# Patient Record
Sex: Male | Born: 1985
Health system: Southern US, Community
[De-identification: ages and names within clinical notes are randomized; demographics above are authoritative.]

---

## 2015-09-20 ENCOUNTER — Encounter: Payer: Self-pay | Admitting: Osteopathic Medicine

## 2015-09-20 ENCOUNTER — Ambulatory Visit (INDEPENDENT_AMBULATORY_CARE_PROVIDER_SITE_OTHER): Payer: Commercial Managed Care - PPO | Admitting: Osteopathic Medicine

## 2015-09-20 VITALS — BP 139/83 | HR 89 | Ht 73.0 in | Wt 212.0 lb

## 2015-09-20 DIAGNOSIS — Z0000 Encounter for general adult medical examination without abnormal findings: Secondary | ICD-10-CM | POA: Diagnosis not present

## 2015-09-20 DIAGNOSIS — Z113 Encounter for screening for infections with a predominantly sexual mode of transmission: Secondary | ICD-10-CM | POA: Diagnosis not present

## 2015-09-20 DIAGNOSIS — Z008 Encounter for other general examination: Secondary | ICD-10-CM

## 2015-09-20 DIAGNOSIS — Z0189 Encounter for other specified special examinations: Secondary | ICD-10-CM | POA: Diagnosis not present

## 2015-09-20 NOTE — Progress Notes (Signed)
HPI: Flonnie HailstoneJamal Russo is a 30 y.o. male  who [facility] to [facility] today, 09/20/15,  for chief complaint of:  Chief Complaint  Patient presents with  . Establish Care    Biometric Screening and completion of form for work/insurance. No complaints or additional concerns today. See below for review preventive care.   Past medical, surgical, social and family history reviewed: No past medical history on file. No past surgical history on file. Social History  Substance Use Topics  . Smoking status: Not on file  . Smokeless tobacco: Not on file  . Alcohol use Not on file   No family history on file.   Current medication list and allergy/intolerance information reviewed:   No current outpatient prescriptions on file.   No current facility-administered medications for this visit.    Allergies not on file    Review of Systems:  Constitutional:  No  fever, no chills, No recent illness, No unintentional weight changes. No significant fatigue.   HEENT: No  headache, no vision change, no hearing change, No sore throat, No  sinus pressure  Cardiac: No  chest pain, No  pressure, No palpitations, No  Orthopnea  Respiratory:  No  shortness of breath. No  Cough  Gastrointestinal: No  abdominal pain, No  nausea, No  vomiting,  No  blood in stool, No  diarrhea, No  constipation   Musculoskeletal: No new myalgia/arthralgia  Genitourinary: No  incontinence, No  abnormal genital bleeding, No abnormal genital discharge  Skin: No  Rash, No other wounds/concerning lesions  Hem/Onc: No  easy bruising/bleeding, No  abnormal lymph node  Endocrine: No cold intolerance,  No heat intolerance. No polyuria/polydipsia/polyphagia   Neurologic: No  weakness, No  dizziness, No  slurred speech/focal weakness/facial droop  Psychiatric: No  concerns with depression, No  concerns with anxiety, No sleep problems, No mood problems, PHQ2 neg   Exam:  BP 139/83   Pulse  89   Ht 6\' 1"  (1.854 m)   Wt 212 lb (96.2 kg)   BMI 27.97 kg/m   Constitutional: VS see above. General Appearance: alert, well-developed, well-nourished, NAD  Eyes: Normal lids and conjunctive, non-icteric sclera  Ears, Nose, Mouth, Throat: MMM, Normal external inspection ears/nares/mouth/lips/gums. TM normal bilaterally. Pharynx/tonsils no erythema, no exudate. Nasal mucosa normal.   Neck: No masses, trachea midline. No thyroid enlargement. No tenderness/mass appreciated. No lymphadenopathy  Respiratory: Normal respiratory effort. no wheeze, no rhonchi, no rales  Cardiovascular: S1/S2 normal, no murmur, no rub/gallop auscultated. RRR. No lower extremity edema. Pedal pulse II/IV bilaterally DP and PT. No carotid bruit or JVD. No abdominal aortic bruit.  Gastrointestinal: Nontender, no masses. No hepatomegaly, no splenomegaly. No hernia appreciated. Bowel sounds normal. Rectal exam deferred.   Musculoskeletal: Gait normal. No clubbing/cyanosis of digits.   Neurological: No cranial nerve deficit on limited exam. Motor and sensation intact and symmetric. Cerebellar reflexes intact. Normal balance/coordination. No tremor.   Skin: warm, dry, intact. No rash/ulcer. No concerning nevi or subq nodules on limited exam.    Psychiatric: Normal judgment/insight. Normal mood and affect. Oriented x3.     MALE PREVENTIVE CARE updated 09/20/15  ANNUAL SCREENING/COUNSELING  Any changes to health in the past year? no  Tobacco - 1 ppd x 5 years, (+) interest in quitting   Alcohol - social drinker  Diet/Exercise - Healthy habits discussed to decrease CV risk and promote overall health. Patient does not have dietary restrictions.   Depression - PQH2 Negative  Feel  safe at home? - yes  HTN SCREENING - SEE VITALS  SEXUAL/REPRODUCTIVE HEALTH  Sexually active? - Yes with male.  STI testing needed/desired today? - yes  Any concerns with testosterone/libido? - no  INFECTIOUS DISEASE  SCREENING  HIV - needs  GC/CT - needs  HepC - does not need  TB - does not need  CANCER SCREENING  Lung - does not need  Colon - does not need  Prostate - does not need  OTHER DISEASE SCREENING  Lipid - needs  DM2 - needs  Osteoporosis - does not need  ADULT VACCINATION  Influenza - needs today, annual vaccine recommended but declined  Td - was not indicated  Zoster - was not indicated  Pneumonia - was not indicated     ASSESSMENT/PLAN:   Annual physical exam - Plan: Lipid panel, CMP and Liver, CBC  Encounter for biometric screening  Routine screening for STI (sexually transmitted infection) - Plan: HIV antibody, RPR, Hepatitis B surface antigen, Hepatitis B core antibody, total, Chlamydia/Gonococcus/Trichomonas, NAA   Plan:  1. You were given orders for labs/blood draw - please have this done at [facility] and results will be sent to [place].  2. If any concerning results, we may ask you to come in for a visit to discuss these results. We will call you - please let us know if you don't hear back about your results.  3. As long as you're doing well, come back in one year for routine physical. 4. Please confirm your last Tetanus booster - this should be given every 10 years.     Visit summary with medication list and pertinent instructions was printed for patient to review. All questions at time of visit were answered - patient instructed to contact office with any additional concerns. ER/RTC precautions were reviewed with the patient. Follow-up plan: Return in about 1 year (around 09/19/2016), or sooner i fneeded, for ANNUAL PHYSICAL.

## 2015-09-20 NOTE — Patient Instructions (Addendum)
Plan:  1. You were given orders for labs/blood draw - please have this done at [facility] and results will be sent to [place].  2. If any concerning results, we may ask you to come in for a visit to discuss these results. We will call you - please let us know if you don't hear back about your results.  3. As long as you're doing well, come back in one year for routine physical. 4. Please confirm your last Tetanus booster - this should be given every 10 years.        Smoking Cessation, Tips for Success If you are ready to quit smoking, congratulations! You have chosen to help yourself be healthier. Cigarettes bring nicotine, tar, carbon monoxide, and other irritants into your body. Your lungs, heart, and blood vessels will be able to work better without these poisons. There are many different ways to quit smoking. Nicotine gum, nicotine patches, a nicotine inhaler, or nicotine nasal spray can help with physical craving. Hypnosis, support groups, and medicines help break the habit of smoking. WHAT THINGS CAN I DO TO MAKE QUITTING EASIER?  Here are some tips to help you quit for good:  Pick a date when you will quit smoking completely. Tell all of your friends and family about your plan to quit on that date.  Do not try to slowly cut down on the number of cigarettes you are smoking. Pick a quit date and quit smoking completely starting on that day.  Throw away all cigarettes.   Clean and remove all ashtrays from your home, work, and car.  On a card, write down your reasons for quitting. Carry the card with you and read it when you get the urge to smoke.  Cleanse your body of nicotine. Drink enough water and fluids to keep your urine clear or pale yellow. Do this after quitting to flush the nicotine from your body.  Learn to predict your moods. Do not let a bad situation be your excuse to have a cigarette. Some situations in your life might tempt you into wanting a cigarette.  Never have "just  one" cigarette. It leads to wanting another and another. Remind yourself of your decision to quit.  Change habits associated with smoking. If you smoked while driving or when feeling stressed, try other activities to replace smoking. Stand up when drinking your coffee. Brush your teeth after eating. Sit in a different chair when you read the paper. Avoid alcohol while trying to quit, and try to drink fewer caffeinated beverages. Alcohol and caffeine may urge you to smoke.  Avoid foods and drinks that can trigger a desire to smoke, such as sugary or spicy foods and alcohol.  Ask people who smoke not to smoke around you.  Have something planned to do right after eating or having a cup of coffee. For example, plan to take a walk or exercise.  Try a relaxation exercise to calm you down and decrease your stress. Remember, you may be tense and nervous for the first 2 weeks after you quit, but this will pass.  Find new activities to keep your hands busy. Play with a pen, coin, or rubber band. Doodle or draw things on paper.  Brush your teeth right after eating. This will help cut down on the craving for the taste of tobacco after meals. You can also try mouthwash.   Use oral substitutes in place of cigarettes. Try using lemon drops, carrots, cinnamon sticks, or chewing gum. Keep them  handy so they are available when you have the urge to smoke.  When you have the urge to smoke, try deep breathing.  Designate your home as a nonsmoking area.  If you are a heavy smoker, ask your health care provider about a prescription for nicotine chewing gum. It can ease your withdrawal from nicotine.  Reward yourself. Set aside the cigarette money you save and buy yourself something nice.  Look for support from others. Join a support group or smoking cessation program. Ask someone at home or at work to help you with your plan to quit smoking.  Always ask yourself, "Do I need this cigarette or is this just a  reflex?" Tell yourself, "Today, I choose not to smoke," or "I do not want to smoke." You are reminding yourself of your decision to quit.  Do not replace cigarette smoking with electronic cigarettes (commonly called e-cigarettes). The safety of e-cigarettes is unknown, and some may contain harmful chemicals.  If you relapse, do not give up! Plan ahead and think about what you will do the next time you get the urge to smoke. HOW WILL I FEEL WHEN I QUIT SMOKING? You may have symptoms of withdrawal because your body is used to nicotine (the addictive substance in cigarettes). You may crave cigarettes, be irritable, feel very hungry, cough often, get headaches, or have difficulty concentrating. The withdrawal symptoms are only temporary. They are strongest when you first quit but will go away within 10-14 days. When withdrawal symptoms occur, stay in control. Think about your reasons for quitting. Remind yourself that these are signs that your body is healing and getting used to being without cigarettes. Remember that withdrawal symptoms are easier to treat than the major diseases that smoking can cause.  Even after the withdrawal is over, expect periodic urges to smoke. However, these cravings are generally short lived and will go away whether you smoke or not. Do not smoke! WHAT RESOURCES ARE AVAILABLE TO HELP ME QUIT SMOKING? Your health care provider can direct you to community resources or hospitals for support, which may include:  Group support.  Education.  Hypnosis.  Therapy.   This information is not intended to replace advice given to you by your health care provider. Make sure you discuss any questions you have with your health care provider.   Document Released: 10/06/2003 Document Revised: 01/28/2014 Document Reviewed: 06/25/2012 Elsevier Interactive Patient Education [employer]2016 [employer]vier Inc.

## 2015-09-21 LAB — CHLAMYDIA/GC NAA, CONFIRMATION
Chlamydia by NAA: NEGATIVE
Gonococcus by NAA: NEGATIVE
Trich vag by NAA: NEGATIVE

## 2015-09-21 LAB — BASIC METABOLIC PANEL
BUN: 9 mg/dL (ref 4–21)
BUN: 9 mg/dL (ref 4–21)
CREATININE: 1.2 mg/dL (ref 0.6–1.3)
Creatinine: 1.2 mg/dL (ref 0.6–1.3)
Glucose: 104 mg/dL
Glucose: 104 mg/dL
Potassium: 4.9 mmol/L (ref 3.4–5.3)
SODIUM: 143 mmol/L (ref 137–147)

## 2015-09-21 LAB — LIPID PANEL
CHOLESTEROL: 183 mg/dL (ref 0–200)
HDL: 53 mg/dL (ref 35–70)
LDL Cholesterol: 102 mg/dL
Triglycerides: 138 mg/dL (ref 40–160)

## 2015-09-21 LAB — CBC AND DIFFERENTIAL
HEMATOCRIT: 48 % (ref 41–53)
HEMOGLOBIN: 16.6 g/dL (ref 13.5–17.5)
Hemoglobin: 16.6 g/dL (ref 13.5–17.5)
WBC: 5.6 10^3/mL
WBC: 5.9 10*3/mL

## 2015-09-21 LAB — HEPATIC FUNCTION PANEL
ALT: 93 U/L — AB (ref 10–40)
ALT: 93 U/L — AB (ref 10–40)
AST: 65 U/L — AB (ref 14–40)
AST: 65 U/L — AB (ref 14–40)
Alkaline Phosphatase: 106 U/L (ref 25–125)
BILIRUBIN DIRECT: 0.17 mg/dL (ref 0.01–0.4)
BILIRUBIN, TOTAL: 0.4 mg/dL
Bilirubin, Total: 0.4 mg/dL

## 2015-09-21 LAB — RPR, QUANT. (REFLEX): RPR: NEGATIVE

## 2015-09-21 LAB — HIV ANTIBODY (ROUTINE TESTING W REFLEX): HIV SCREEN 4TH GENERATION: NEGATIVE

## 2015-09-21 LAB — HEPATITIS B SURFACE ANTIGEN
HEP B S AG: NEGATIVE
Hep B Core Ab, Tot: NEGATIVE
Hep C Virus Ab: <0.1

## 2015-09-22 ENCOUNTER — Telehealth: Payer: Self-pay | Admitting: Osteopathic Medicine

## 2015-09-22 NOTE — Telephone Encounter (Signed)
Please call patient: I have reviewed his lab results. Need to confirm whether or not he was fasting for these labs, sugar was very mildly elevated at 104, anything over 100 fasting is considered abnormal. More concerning though is to have his liver enzymes were significantly elevated. Not to a dangerous level but this is definitely something that needs to be followed up. Would recommend he schedule an office visit to talk more about this and discuss what needs to be done as far as further testing.  I placed labs ([facility]) in your basket to abstract/scan  Also, can we call [facility] and see if we can add on hepatitis C screening, I thought it ordered this but it looks like I didn't, my apologies. If patient asks, hepatitis B was negative, hepatitis C testing is pending, other labs were normal including negative HIV and syphilis, cholesterol was okay, all other labs were fine

## 2015-09-26 NOTE — Telephone Encounter (Signed)
Called [facility], added Hep C antibody. They will fax results.

## 2015-09-26 NOTE — Telephone Encounter (Signed)
Left VM for Pt to return clinic call regarding results. Callback information provided.

## 2015-09-27 NOTE — Telephone Encounter (Signed)
Thanks. If we don't hear from him, may need to send letter. I've set reminder to follow up on this unless we hear sooner.

## 2015-10-06 ENCOUNTER — Encounter: Payer: Self-pay | Admitting: *Deleted

## 2015-10-06 LAB — RPR, QUANT. (REFLEX)

## 2015-10-17 ENCOUNTER — Telehealth: Payer: Self-pay | Admitting: Osteopathic Medicine

## 2015-10-17 NOTE — Telephone Encounter (Signed)
Letter sent regarding lab results.

## 2015-10-20 ENCOUNTER — Encounter: Payer: Self-pay | Admitting: Osteopathic Medicine

## 2015-10-31 ENCOUNTER — Ambulatory Visit (INDEPENDENT_AMBULATORY_CARE_PROVIDER_SITE_OTHER): Payer: Commercial Managed Care - PPO | Admitting: Osteopathic Medicine

## 2015-10-31 ENCOUNTER — Encounter: Payer: Self-pay | Admitting: Osteopathic Medicine

## 2015-10-31 VITALS — BP 135/85 | HR 85 | Ht 73.0 in | Wt 221.0 lb

## 2015-10-31 DIAGNOSIS — R748 Abnormal levels of other serum enzymes: Secondary | ICD-10-CM

## 2015-10-31 DIAGNOSIS — L738 Other specified follicular disorders: Secondary | ICD-10-CM | POA: Diagnosis not present

## 2015-10-31 NOTE — Patient Instructions (Signed)
Benzoyl Peroxide wash for skin. If no better with this plus exfoliating, call and let me know and we can try a medication.  Will repeat labs and see if liver tests are still high, will need to do further workup.

## 2015-10-31 NOTE — Progress Notes (Signed)
HPI: James Russo is a 30 y.o. male  who presents [facility] [place] today, 10/31/15,  for chief complaint of:  Chief Complaint  Patient presents with  . Follow-up    LABS    Abn labs . Context: elevated liver enzymes, new problem . Severity: >2x ULN AST . Modifying factors: pt reports heavy alcohol use weekend prior to blood draw, no known FH liver problems except fatty liver in father who is a diabetic . Assoc signs/symptoms: no abdominal pain or jaundice  Skin concern: . Location: upper back/shoulders . Quality: bumps, ?acne, not itching . Modifying factors: regular soap, exfoliation tried   Past medical, surgical, social and family history reviewed: No past medical history on file. No past surgical history on file. Social History  Substance Use Topics  . Smoking status: Current Every Day Smoker  . Smokeless tobacco: Never Used  . Alcohol use Not on file   Family History  Problem Relation Age of Onset  . Hypertension Mother   . Hypertension Father   . Alcohol abuse Maternal Uncle      Current medication list and allergy/intolerance information reviewed:   No current outpatient prescriptions on file.   No current facility-administered medications for this visit.    No Known Allergies    Review of Systems:  Constitutional:  No  fever, no chills, No recent illness  Cardiac: No  chest pain  Respiratory:  No  shortness of breath.   Gastrointestinal: No  abdominal pain, No  nausea, No  vomiting,  No  blood in stool,   Skin: + acne vs Rash, No other wounds/concerning lesions  Neurologic: No  weakness, No  dizziness  Exam:  BP 135/85   Pulse 85   Ht 6\' 1"  (1.854 m)   Wt 221 lb (100.2 kg)   BMI 29.16 kg/m   Constitutional: VS see above. General Appearance: alert, well-developed, well-nourished, NAD  Eyes: Normal lids and conjunctive, non-icteric sclera  Ears, Nose, Mouth, Throat: MMM  Neck: No masses, trachea midline.    Respiratory: Normal respiratory effort.   Skin: warm, dry, intact. No rash/ulcer. No concerning nevi or subq nodules on limited exam. +mild acne vs pseudofolliculitis  Psychiatric: Normal judgment/insight. Normal mood and affect. Oriented x3.    ASSESSMENT/PLAN:   Labs reviewed in detail with the patient. Explained process of repeating labs to confirm, additional workup based on whether enzymes just all elevated. Advised patient weight several days after he has had any alcohol. Advised on safe alcohol consumption practices.   Can consider retinoids or topical therapy for pseudofolliculitis versus acne  Elevated liver enzymes - Plan: CMP and Liver, TSH, Iron and TIBC  Pseudofolliculitis   Patient Instructions  Benzoyl Peroxide wash for skin. If no better with this plus exfoliating, call and let me know and we can try a medication.  Will repeat labs and see if liver tests are still high, will need to do further workup.     Visit summary with medication list and pertinent instructions was printed for patient to review. All questions at time of visit were answered - patient instructed to contact office with any additional concerns. ER/RTC precautions were reviewed with the patient. Follow-up plan: No Follow-up on file.

## 2016-09-27 ENCOUNTER — Encounter: Payer: Self-pay | Admitting: Osteopathic Medicine

## 2016-09-27 ENCOUNTER — Ambulatory Visit (INDEPENDENT_AMBULATORY_CARE_PROVIDER_SITE_OTHER): Payer: 59 | Admitting: Osteopathic Medicine

## 2016-09-27 VITALS — BP 143/89 | HR 109 | Temp 98.8°F | Ht 73.0 in | Wt 228.0 lb

## 2016-09-27 DIAGNOSIS — B9789 Other viral agents as the cause of diseases classified elsewhere: Secondary | ICD-10-CM | POA: Diagnosis not present

## 2016-09-27 DIAGNOSIS — F10982 Alcohol use, unspecified with alcohol-induced sleep disorder: Secondary | ICD-10-CM | POA: Diagnosis not present

## 2016-09-27 DIAGNOSIS — G4709 Other insomnia: Secondary | ICD-10-CM | POA: Diagnosis not present

## 2016-09-27 DIAGNOSIS — J069 Acute upper respiratory infection, unspecified: Secondary | ICD-10-CM | POA: Diagnosis not present

## 2016-09-27 DIAGNOSIS — H578 Other specified disorders of eye and adnexa: Secondary | ICD-10-CM

## 2016-09-27 DIAGNOSIS — F1010 Alcohol abuse, uncomplicated: Secondary | ICD-10-CM

## 2016-09-27 DIAGNOSIS — R748 Abnormal levels of other serum enzymes: Secondary | ICD-10-CM

## 2016-09-27 DIAGNOSIS — G4700 Insomnia, unspecified: Secondary | ICD-10-CM | POA: Insufficient documentation

## 2016-09-27 DIAGNOSIS — H5789 Other specified disorders of eye and adnexa: Secondary | ICD-10-CM

## 2016-09-27 MED ORDER — IPRATROPIUM BROMIDE 0.06 % NA SOLN: 2.0000 | Freq: Four times a day (QID) | NASAL | 12 refills | Status: DC

## 2016-09-27 MED ORDER — BENZONATATE 200 MG PO CAPS: 200.0000 mg | ORAL_CAPSULE | Freq: Three times a day (TID) | ORAL | 0 refills | Status: DC | PRN

## 2016-09-27 MED ORDER — POLYMYXIN B-TRIMETHOPRIM 10000-0.1 UNIT/ML-% OP SOLN: 1.0000 [drp] | OPHTHALMIC | 0 refills | Status: DC

## 2016-09-27 NOTE — Patient Instructions (Signed)
Plan:  Treat eye and symptoms of upper respiratory infection  Labs today to recheck liver  Reduce/stop alcohol use! Seek medical care if stopping causes: panic/anxiety, sweats, shakes/jitters, heart flutters, headache, nausea/vomiting   Depending on labs, may need further workup  We may be able to go ahead and start an insomnia medication depending on lab results - will call you

## 2016-09-27 NOTE — Progress Notes (Signed)
HPI: James Russo is a 31 y.o. male  who [facility] [place] today, 09/27/16,  for chief complaint of:  Chief Complaint  Patient presents with  . Eye Problem    Left eye red    Left eye red and irritated for about a day. Associated with subjective fever, sinus pressure and drainage, sore throat, slight dry cough. Ongoing for about a day.   Insomnia: Patient has been self-medicating with alcohol, he states "I drink quite a good bit," reports at least 5-8 12 ounce beers per evening. He quit for about a month in the past but noticed that insomnia was still an issue for him.  Elevated liver enzymes: Patient did not get blood work repeated after last visit.   Past medical history, surgical history, social history and family history reviewed.  Patient Active Problem List   Diagnosis Date Noted  . 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 Elevated liver enzymes 10/31/2015    Current medication list and allergy/intolerance information reviewed.   No current outpatient prescriptions on file prior to visit.   No current facility-administered medications on file prior to visit.    No Known Allergies    Review of Systems:  Constitutional: +recent illness  HEENT: +sinus headache, no vision change, +sore throat, +eye red as per HPI  Cardiac: No  chest pain, No  pressure, No palpitations  Respiratory:  No  shortness of breath. +dry Cough  Gastrointestinal: No  abdominal pain, no change on bowel habits, no N/V  Musculoskeletal: No new myalgia/arthralgia  Skin: No  Rash  Neurologic: No  weakness, No  Dizziness  Psychiatric: No  concerns with depression, No  concerns with anxiety  Exam:  BP (!) 143/89   Pulse (!) 109   Temp 98.8 F (37.1 C) (Oral)   Ht 6\' 1"  (1.854 m)   Wt 228 lb (103.4 kg)   BMI 30.08 kg/m   Constitutional: VS see above. General Appearance: alert, well-developed, well-nourished, NAD  Eyes: Normal lids, non-icteric sclera.  Left eye demonstrates conjunctival injection, no purulent drainage  Ears, Nose, Mouth, Throat: MMM, Normal external inspection ears/nares/mouth/lips/gums. Pharyngeal erythema but no exudate.  Neck: No masses, trachea midline. Reports slight tenderness to submandibular lymph nodes but no enlargement, skin normal  Respiratory: Normal respiratory effort. no wheeze, no rhonchi, no rales  Cardiovascular: S1/S2 normal, no murmur, no rub/gallop auscultated. RRR.   Musculoskeletal: Gait normal. Symmetric and independent movement of all extremities  Neurological: Normal balance/coordination. No tremor.  Skin: warm, dry, intact.   Psychiatric: Normal judgment/insight. Normal mood and affect. Oriented x3.      ASSESSMENT/PLAN:   Red eye - Plan: trimethoprim-polymyxin b (POLYTRIM) ophthalmic solution  Elevated liver enzymes - Plan: COMPLETE METABOLIC PANEL WITH GFR, TSH, Fe+TIBC+Fer  Other insomnia - Plan: COMPLETE METABOLIC PANEL WITH GFR, TSH  Viral URI with cough - Plan: benzonatate (TESSALON) 200 MG capsule, ipratropium (ATROVENT) 0.06 % nasal spray    Patient Instructions  Plan:  Treat eye and symptoms of upper respiratory infection  Labs today to recheck liver  Reduce/stop alcohol use! Seek medical care if stopping causes: panic/anxiety, sweats, shakes/jitters, heart flutters, headache, nausea/vomiting   Depending on labs, may need further workup  We may be able to go ahead and start an insomnia medication depending on lab results - will call you     Follow-up plan: Return for recheck insomnia and review labs 1-2 weeks .  Visit summary with medication list and pertinent instructions was printed for patient  to review, alert [place] if any changes needed. All questions at time of visit were answered - patient instructed to contact office with any additional concerns. ER/RTC precautions were reviewed with the patient and understanding verbalized.   Note: Total time spent 25  minutes, greater than 50% of the visit was spent face-to-face counseling and coordinating care for the following: The primary encounter diagnosis was Red eye. Diagnoses of Elevated liver enzymes, Other insomnia, and Viral URI with cough were also pertinent to this visit.Marland Kitchen

## 2016-09-28 LAB — IRON,TIBC AND FERRITIN PANEL
%SAT: 4 % — AB (ref 15–60)
Ferritin: 257 ng/mL (ref 20–345)
Iron: 18 ug/dL — ABNORMAL LOW (ref 50–195)
TIBC: 420 ug/dL (ref 250–425)

## 2016-09-28 LAB — COMPLETE METABOLIC PANEL WITH GFR
AG Ratio: 1.5 (calc) (ref 1.0–2.5)
ALT: 69 U/L — ABNORMAL HIGH (ref 9–46)
AST: 43 U/L — ABNORMAL HIGH (ref 10–40)
Albumin: 4.8 g/dL (ref 3.6–5.1)
Alkaline phosphatase (APISO): 95 U/L (ref 40–115)
BUN: 12 mg/dL (ref 7–25)
CO2: 31 mmol/L (ref 20–32)
Calcium: 9.7 mg/dL (ref 8.6–10.3)
Chloride: 100 mmol/L (ref 98–110)
Creat: 1.29 mg/dL (ref 0.60–1.35)
GFR, Est African American: 86 mL/min/{1.73_m2} (ref >=60)
GFR, Est Non African American: 74 mL/min/{1.73_m2} (ref >=60)
Globulin: 3.2 g/dL (calc) (ref 1.9–3.7)
Glucose, Bld: 97 mg/dL (ref 65–99)
Potassium: 3.9 mmol/L (ref 3.5–5.3)
Sodium: 137 mmol/L (ref 135–146)
Total Bilirubin: 1.1 mg/dL (ref 0.2–1.2)
Total Protein: 8 g/dL (ref 6.1–8.1)

## 2016-09-28 LAB — TSH: TSH: 0.7 m[IU]/L (ref 0.40–4.50)

## 2016-09-30 MED ORDER — TRAZODONE HCL 100 MG PO TABS
ORAL_TABLET | ORAL | 1 refills | Status: DC
Start: 2016-09-30 — End: 2017-02-22

## 2016-09-30 NOTE — Addendum Note (Signed)
Addended by: Deirdre PippinsALEXANDER, Eriko Economos M on: 09/30/2016 04:31 PM   Modules accepted: Orders

## 2016-10-10 ENCOUNTER — Ambulatory Visit: Payer: 59 | Admitting: Osteopathic Medicine

## 2016-10-10 DIAGNOSIS — Z0189 Encounter for other specified special examinations: Secondary | ICD-10-CM

## 2017-02-22 ENCOUNTER — Other Ambulatory Visit: Payer: Self-pay | Admitting: Osteopathic Medicine

## 2017-12-09 DIAGNOSIS — Z20818 Contact with and (suspected) exposure to other bacterial communicable diseases: Secondary | ICD-10-CM | POA: Diagnosis not present

## 2017-12-09 DIAGNOSIS — J029 Acute pharyngitis, unspecified: Secondary | ICD-10-CM | POA: Diagnosis not present

## 2018-04-27 ENCOUNTER — Other Ambulatory Visit: Payer: Self-pay

## 2018-04-27 ENCOUNTER — Encounter: Payer: Self-pay | Admitting: Osteopathic Medicine

## 2018-04-27 ENCOUNTER — Ambulatory Visit: Payer: BLUE CROSS/BLUE SHIELD | Admitting: Osteopathic Medicine

## 2018-04-27 VITALS — BP 133/87 | HR 96 | Temp 98.7°F | Wt 243.8 lb

## 2018-04-27 DIAGNOSIS — L0291 Cutaneous abscess, unspecified: Secondary | ICD-10-CM

## 2018-04-27 NOTE — Progress Notes (Signed)
HPI: James Russo is a 33 y.o. male who  has no past medical history on file.  he presents to [facility] today, 04/27/18,  for chief complaint of:  Skin problem: boil on face   . Location: R face lateral to eye . Quality/severity: was larger and more painful but now smaller and sore without significant tenderness.  . Duration: few weeks total  . Modifying factors: running warm water over it in the shower twice a day helped  . Assoc signs/symptoms: no fever/ no eye problems      At today's visit 04/27/18 ... PMH, PSH, FH reviewed and updated as needed.  Current medication list and allergy/intolerance hx reviewed and updated as needed. (See remainder of HPI, ROS, Phys Exam below)   No results found.  No results found for this or any previous visit (from the past 72 hour(s)).        ASSESSMENT/PLAN: The encounter diagnosis was Abscess. healing well, indurated, no erythema, minimal tenderness, pt reports improvement. I think we can be more diligent about warm compresses and see if this resolves on its own over the next few weeks, I&D doesn't seem warranted at this time.       Follow-up plan: Return if symptoms worsen or fail to improve.                                                 ################################################# ################################################# ################################################# #################################################    No outpatient medications have been marked as taking for the 04/27/18 encounter (Office Visit) with Sunnie Nielsen, DO.    No Known Allergies     Review of Systems:  Constitutional: No recent illness  HEENT: No  headache, no vision change  Skin: No  Rash, +bump as per HPI  Neurologic: No  weakness, No  Dizziness   Exam:  BP 133/87 (BP Location: Left Arm, Patient Position: Sitting, Cuff Size:  Large)   Pulse 96   Temp 98.7 F (37.1 C) (Oral)   Wt 243 lb 12.8 oz (110.6 kg)   BMI 32.17 kg/m   Constitutional: VS see above. General Appearance: alert, well-developed, well-nourished, NAD  Eyes: Normal lids and conjunctive, non-icteric sclera  Ears, Nose, Mouth, Throat: MMM, Normal external inspection ears/nares/mouth/lips/gums.  Neck: No masses, trachea midline.   Respiratory: Normal respiratory effort.  Musculoskeletal: Gait normal. Symmetric and independent movement of all extremities  Neurological: Normal balance/coordination. No tremor.  Skin: warm, dry, intact. Small relatively mobile subq mass about pea-sized, lateral to R eye, no erythema, no fluctuance  Psychiatric: Normal judgment/insight. Normal mood and affect. Oriented x3.       Visit summary with medication list and pertinent instructions was printed for patient to review, patient was advised to alert [place] if any updates are needed. All questions at time of visit were answered - patient instructed to contact office with any additional concerns. ER/RTC precautions were reviewed with the patient and understanding verbalized.     Please note: voice recognition software was used to produce this document, and typos may escape review. Please contact Dr. Lyn Hollingshead for any needed clarifications.    Follow up plan: Return if symptoms worsen or fail to improve.

## 2018-07-07 DIAGNOSIS — K529 Noninfective gastroenteritis and colitis, unspecified: Secondary | ICD-10-CM | POA: Diagnosis not present

## 2018-07-14 ENCOUNTER — Ambulatory Visit (INDEPENDENT_AMBULATORY_CARE_PROVIDER_SITE_OTHER): Payer: BC Managed Care – PPO | Admitting: Osteopathic Medicine

## 2018-07-14 ENCOUNTER — Other Ambulatory Visit: Payer: Self-pay

## 2018-07-14 ENCOUNTER — Encounter: Payer: Self-pay | Admitting: Osteopathic Medicine

## 2018-07-14 VITALS — BP 145/88 | HR 96 | Temp 98.8°F | Wt 242.6 lb

## 2018-07-14 DIAGNOSIS — L723 Sebaceous cyst: Secondary | ICD-10-CM

## 2018-07-14 DIAGNOSIS — S80211A Abrasion, right knee, initial encounter: Secondary | ICD-10-CM | POA: Diagnosis not present

## 2018-07-14 DIAGNOSIS — R748 Abnormal levels of other serum enzymes: Secondary | ICD-10-CM

## 2018-07-14 DIAGNOSIS — H1131 Conjunctival hemorrhage, right eye: Secondary | ICD-10-CM

## 2018-07-14 DIAGNOSIS — Z0000 Encounter for general adult medical examination without abnormal findings: Secondary | ICD-10-CM
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
## 2018-07-22 DIAGNOSIS — Z0000 Encounter for general adult medical examination without abnormal findings: Secondary | ICD-10-CM | POA: Diagnosis not present

## 2018-07-23 LAB — CBC
HCT: 47.1 % (ref 38.5–50.0)
Hemoglobin: 16.2 g/dL (ref 13.2–17.1)
MCH: 29.2 pg (ref 27.0–33.0)
MCHC: 34.4 g/dL (ref 32.0–36.0)
MCV: 85 fL (ref 80.0–100.0)
MPV: 11.4 fL (ref 7.5–12.5)
Platelets: 215 10*3/uL (ref 140–400)
RBC: 5.54 10*6/uL (ref 4.20–5.80)
RDW: 14.3 % (ref 11.0–15.0)
WBC: 9.3 10*3/uL (ref 3.8–10.8)

## 2018-07-23 LAB — COMPLETE METABOLIC PANEL WITH GFR
AG Ratio: 1.8 (calc) (ref 1.0–2.5)
ALT: 94 U/L — ABNORMAL HIGH (ref 9–46)
AST: 57 U/L — ABNORMAL HIGH (ref 10–40)
Albumin: 5.1 g/dL (ref 3.6–5.1)
Alkaline phosphatase (APISO): 85 U/L (ref 36–130)
BUN: 10 mg/dL (ref 7–25)
CO2: 25 mmol/L (ref 20–32)
Calcium: 9.9 mg/dL (ref 8.6–10.3)
Chloride: 104 mmol/L (ref 98–110)
Creat: 1.24 mg/dL (ref 0.60–1.35)
GFR, Est African American: 90 mL/min/{1.73_m2} (ref >=60)
GFR, Est Non African American: 78 mL/min/{1.73_m2} (ref >=60)
Globulin: 2.8 g/dL (calc) (ref 1.9–3.7)
Glucose, Bld: 88 mg/dL (ref 65–99)
Potassium: 4.1 mmol/L (ref 3.5–5.3)
Sodium: 139 mmol/L (ref 135–146)
Total Bilirubin: 0.9 mg/dL (ref 0.2–1.2)
Total Protein: 7.9 g/dL (ref 6.1–8.1)

## 2018-07-23 LAB — LIPID PANEL
Cholesterol: 195 mg/dL (ref <200)
HDL: 56 mg/dL (ref >=40)
LDL Cholesterol (Calc): 120 mg/dL (calc) — ABNORMAL HIGH
Non-HDL Cholesterol (Calc): 139 mg/dL (calc) — ABNORMAL HIGH (ref <130)
Total CHOL/HDL Ratio: 3.5 (calc) (ref <5.0)
Triglycerides: 91 mg/dL (ref <150)

## 2018-07-23 NOTE — Addendum Note (Signed)
Addended by: Maryla Morrow on: 07/23/2018 07:23 AM   Modules accepted: Orders

## 2018-07-29 ENCOUNTER — Other Ambulatory Visit: Payer: Self-pay

## 2018-07-29 ENCOUNTER — Ambulatory Visit (INDEPENDENT_AMBULATORY_CARE_PROVIDER_SITE_OTHER): Payer: BC Managed Care – PPO

## 2018-07-29 DIAGNOSIS — R748 Abnormal levels of other serum enzymes: Secondary | ICD-10-CM | POA: Diagnosis not present

## 2018-07-29 DIAGNOSIS — R945 Abnormal results of liver function studies: Secondary | ICD-10-CM | POA: Diagnosis not present

## 2018-07-29 DIAGNOSIS — K760 Fatty (change of) liver, not elsewhere classified: Secondary | ICD-10-CM

## 2018-07-30 ENCOUNTER — Encounter: Payer: Self-pay | Admitting: Osteopathic Medicine

## 2018-07-30 ENCOUNTER — Ambulatory Visit (INDEPENDENT_AMBULATORY_CARE_PROVIDER_SITE_OTHER): Payer: BC Managed Care – PPO | Admitting: Osteopathic Medicine

## 2018-07-30 VITALS — BP 139/81 | HR 98 | Temp 98.7°F | Wt 237.9 lb

## 2018-07-30 DIAGNOSIS — K760 Fatty (change of) liver, not elsewhere classified: Secondary | ICD-10-CM | POA: Diagnosis not present

## 2018-07-30 DIAGNOSIS — Z0000 Encounter for general adult medical examination without abnormal findings: Secondary | ICD-10-CM | POA: Diagnosis not present

## 2018-07-30 DIAGNOSIS — Z23 Encounter for immunization: Secondary | ICD-10-CM

## 2018-07-30 MED ORDER — TRAZODONE HCL 50 MG PO TABS: 50.0000 mg | ORAL_TABLET | Freq: Every evening | ORAL | 1 refills | Status: AC | PRN

## 2018-07-30 NOTE — Patient Instructions (Signed)
General Preventive Care  Most recent routine screening lipids/other labs: already done!  Everyone should have blood pressure checked once per year.   Tobacco: don't! Please let me know if you need help quitting!  Alcohol: responsible moderation is ok for most adults - if you have concerns about your alcohol intake, please talk to me!   Exercise: as tolerated to reduce risk of cardiovascular disease and diabetes. Strength training will also prevent osteoporosis.   Mental health: if need for mental health care (medicines, counseling, other), or concerns about moods, please let me know!   Sexual health: if need for STD testing, or if concerns with libido/pain problems, please let me know!  Advanced Directive: Living Will and/or Healthcare Power of Attorney recommended for all adults, regardless of age or health.  Vaccines  Flu vaccine: recommended for almost everyone, every fall.   Shingles vaccine: Shingrix recommended after age 57.   Pneumonia vaccines: Prevnar and Pneumovax recommended after age 61  Tetanus booster: Tdap recommended every 10 years.   HPV vaccine: Gardasil recommended up to age 46 to prevent HPV-associated diseases, including certain cancers.  Cancer screenings   Colon cancer screening: recommended for everyone at age 62, but some folks need a colonoscopy sooner if risk factors   Prostate cancer screening: PSA blood test around age 36  Lung cancer screening: CT chest every year for those age 65 to 31 years old with ?30 pack year smoking history, who either currently smoke or have quit within the past 15 years. Infection screenings . HIV: recommended screening at least once age 60-65, more often as needed. . Gonorrhea/Chlamydia: screening as needed. . Hepatitis C: recommended for anyone born 70-1963 . TB: certain at-risk populations, or depending on work requirements and/or travel history  Other . Bone Density Test: recommended for men at age 91, sooner  depending on risk factors . Abdominal Aortic Aneurysm: screening with ultrasound recommended once for men age 31-75 who have ever smoked          Fatty Liver Disease  Fatty liver disease occurs when too much fat has built up in your liver cells. Fatty liver disease is also called hepatic steatosis or steatohepatitis. The liver removes harmful substances from your bloodstream and produces fluids that your body needs. It also helps your body use and store energy from the food you eat. In many cases, fatty liver disease does not cause symptoms or problems. It is often diagnosed when tests are being done for other reasons. However, over time, fatty liver can cause inflammation that may lead to more serious liver problems, such as scarring of the liver (cirrhosis) and liver failure. Fatty liver is associated with insulin resistance, increased body fat, high blood pressure (hypertension), and high cholesterol. These are features of metabolic syndrome and increase your risk for stroke, diabetes, and heart disease. What are the causes? This condition may be caused by:  Drinking too much alcohol.  Poor nutrition.  Obesity.  Cushing's syndrome.  Diabetes.  High cholesterol.  Certain drugs.  Poisons.  Some viral infections.  Pregnancy. What increases the risk? You are more likely to develop this condition if you:  Abuse alcohol.  Are overweight.  Have diabetes.  Have hepatitis.  Have a high triglyceride level.  Are pregnant. What are the signs or symptoms? Fatty liver disease often does not cause symptoms. If symptoms do develop, they can include:  Fatigue.  Weakness.  Weight loss.  Confusion.  Abdominal pain.  Nausea and vomiting.  Yellowing of  your skin and the white parts of your eyes (jaundice).  Itchy skin. How is this diagnosed? This condition may be diagnosed by:  A physical exam and medical history.  Blood tests.  Imaging tests, such as an  ultrasound, CT scan, or MRI.  A liver biopsy. A small sample of liver tissue is removed using a needle. The sample is then looked at under a microscope. How is this treated? Fatty liver disease is often caused by other health conditions. Treatment for fatty liver may involve medicines and lifestyle changes to manage conditions such as:  Alcoholism.  High cholesterol.  Diabetes.  Being overweight or obese. Follow these instructions at home:   Do not drink alcohol. If you have trouble quitting, ask your health care provider how to safely quit with the help of medicine or a supervised program. This is important to keep your condition from getting worse.  Eat a healthy diet as told by your health care provider. Ask your health care provider about working with a diet and nutrition specialist (dietitian) to develop an eating plan.  Exercise regularly. This can help you lose weight and control your cholesterol and diabetes. Talk to your health care provider about an exercise plan and which activities are best for you.  Take over-the-counter and prescription medicines only as told by your health care provider.  Keep all follow-up visits as told by your health care provider. This is important. Contact a health care provider if: You have trouble controlling your:  Blood sugar. This is especially important if you have diabetes.  Cholesterol.  Drinking of alcohol. Get help right away if:  You have abdominal pain.  You have jaundice.  You have nausea and vomiting.  You vomit blood or material that looks like coffee grounds.  You have stools that are black, tar-like, or bloody. Summary  Fatty liver disease develops when too much fat builds up in the cells of your liver.  Fatty liver disease often causes no symptoms or problems. However, over time, fatty liver can cause inflammation that may lead to more serious liver problems, such as scarring of the liver (cirrhosis).  You are  more likely to develop this condition if you abuse alcohol, are pregnant, are overweight, have diabetes, have hepatitis, or have high triglyceride levels.  Contact your health care provider if you have trouble controlling your weight, blood sugar, cholesterol, or drinking of alcohol. This information is not intended to replace advice given to you by your health care provider. Make sure you discuss any questions you have with your health care provider. Document Released: 02/22/2005 Document Revised: 12/20/2016 Document Reviewed: 10/16/2016 Elsevier Patient Education  2020 [employer]

## 2018-07-30 NOTE — Progress Notes (Signed)
HPI: James Russo is a 33 y.o. male who  has no past medical history on file.  he presents to [facility] today, 07/30/18,  for chief complaint of: Annual physical     Patient here for annual physical / wellness exam.  See preventive care reviewed as below.  Recent labs reviewed in detail with the patient.  Concerning slight elevation in liver enzymes and ultrasound is consistent with fatty liver, patient drinks maybe 1-2 beers per day pretty consistently, sometimes will have a sixpack.  Relatively healthy diet and he stays physically active.  Additional concerns today include:  None      Past medical, surgical, social and family history reviewed:  Patient Active Problem List   Diagnosis Date Noted  . Excessive drinking alcohol 09/27/2016  . Insomnia 09/27/2016  . 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 Elevated liver enzymes 10/31/2015    History reviewed. No pertinent surgical history.  Social History   Tobacco Use  . Smoking status: Current Every Day Smoker  . Smokeless tobacco: Never Used  Substance Use Topics  . Alcohol use: Not Currently    Family History  Problem Relation Age of Onset  . Hypertension Mother   . Hypertension Father   . Alcohol abuse Maternal Uncle      Current medication list and allergy/intolerance information reviewed:    Current Outpatient Medications  Medication Sig Dispense Refill  . traZODone (DESYREL) 50 MG tablet Take 1-2 tablets (50-100 mg total) by mouth at bedtime as needed for sleep. 90 tablet 1   No current facility-administered medications for this visit.     No Known Allergies    Review of Systems:  Constitutional:  No  fever, no chills, No recent illness,  No unintentional weight changes. No significant fatigue.  HEENT: No  headache, no vision change, no hearing change, No sore throat, No  sinus pressure  Cardiac: No  chest pain, No  pressure, No palpitations, No  Orthopnea   Respiratory:  No  shortness of breath. No  Cough  Gastrointestinal: No  abdominal pain, No  nausea, No  vomiting,  No  blood in stool, No  diarrhea, No  constipation   Musculoskeletal: No new myalgia/arthralgia  Skin: No  Rash, No other wounds/concerning lesions  Genitourinary: No  incontinence, No  abnormal genital bleeding, No abnormal genital discharge  Hem/Onc: No  easy bruising/bleeding, No  abnormal lymph node  Endocrine: No cold intolerance,  No heat intolerance. No polyuria/polydipsia/polyphagia   Neurologic: No  weakness, No  dizziness, No  slurred speech/focal weakness/facial droop  Psychiatric: No  concerns with depression, No  concerns with anxiety, No sleep problems, No mood problems  Exam:  BP 139/81 (BP Location: Left Arm, Patient Position: Sitting, Cuff Size: Large)   Pulse 98   Temp 98.7 F (37.1 C) (Oral)   Wt 237 lb 14.4 oz (107.9 kg)   BMI 31.39 kg/m   Constitutional: VS see above. General Appearance: alert, well-developed, well-nourished, NAD  Eyes: Normal lids and conjunctive, non-icteric sclera  Neck: No masses, trachea midline. No thyroid enlargement. No tenderness/mass appreciated. No lymphadenopathy  Respiratory: Normal respiratory effort. no wheeze, no rhonchi, no rales  Cardiovascular: S1/S2 normal, no murmur, no rub/gallop auscultated. RRR. No lower extremity edema. Pedal pulse II/IV bilaterally DP and PT. No carotid bruit or JVD. No abdominal aortic bruit.  Gastrointestinal: Nontender, no masses. No hepatomegaly, no splenomegaly. No hernia appreciated. Bowel sounds normal. Rectal exam deferred.   Musculoskeletal: Gait normal. No clubbing/cyanosis of  digits.   Neurological: Normal balance/coordination. No tremor. No cranial nerve deficit on limited exam. Motor and sensation intact and symmetric. Cerebellar reflexes intact.   Skin: warm, dry, intact. No rash/ulcer. No concerning nevi or subq nodules on limited exam.    Psychiatric: Normal  judgment/insight. Normal mood and affect. Oriented x3.    No results found for this or any previous visit (from the past 72 hour(s)).  [place] Abdomen Limited Ruq  Result Date: 07/29/2018 CLINICAL DATA:  Elevated liver enzymes EXAM: ULTRASOUND ABDOMEN LIMITED RIGHT UPPER QUADRANT COMPARISON:  None. FINDINGS: Gallbladder: No gallstones or wall thickening visualized. There is no pericholecystic fluid. No sonographic Murphy sign noted by sonographer. Common bile duct: Diameter: 3 mm. No intrahepatic or extrahepatic biliary duct dilatation. Liver: No focal lesion identified. Liver echogenicity overall is increased. Portal vein is patent on color Doppler imaging with normal direction of blood flow towards the liver. IMPRESSION: Diffuse increase in liver echogenicity, a finding indicative of hepatic steatosis. While no focal liver lesions are evident on this study, it must be cautioned that the sensitivity of ultrasound for detection of focal liver lesions is diminished in this circumstance. Study otherwise unremarkable. Electronically Signed   By: Bretta BangWilliam  Woodruff III M.D.   On: 07/29/2018 14:54     ASSESSMENT/PLAN: The primary encounter diagnosis was Need for Tdap vaccination. Diagnoses of Annual physical exam and Hepatic steatosis were also pertinent to this visit.   Orders Placed This Encounter  Procedures  . Tdap vaccine greater than or equal to 7yo IM    Meds ordered this encounter  Medications  . traZODone (DESYREL) 50 MG tablet    Sig: Take 1-2 tablets (50-100 mg total) by mouth at bedtime as needed for sleep.    Dispense:  90 tablet    Refill:  1    Patient Instructions  General Preventive Care  Most recent routine screening lipids/other labs: already done!  Everyone should have blood pressure checked once per year.   Tobacco: don't! Please let me know if you need help quitting!  Alcohol: responsible moderation is ok for most adults - if you have concerns about your alcohol intake,  please talk to me!   Exercise: as tolerated to reduce risk of cardiovascular disease and diabetes. Strength training will also prevent osteoporosis.   Mental health: if need for mental health care (medicines, counseling, other), or concerns about moods, please let me know!   Sexual health: if need for STD testing, or if concerns with libido/pain problems, please let me know!  Advanced Directive: Living Will and/or Healthcare Power of Attorney recommended for all adults, regardless of age or health.  Vaccines  Flu vaccine: recommended for almost everyone, every fall.   Shingles vaccine: Shingrix recommended after age 150.   Pneumonia vaccines: Prevnar and Pneumovax recommended after age 31  Tetanus booster: Tdap recommended every 10 years.   HPV vaccine: Gardasil recommended up to age 31 to prevent HPV-associated diseases, including certain cancers.  Cancer screenings   Colon cancer screening: recommended for everyone at age 31, but some folks need a colonoscopy sooner if risk factors   Prostate cancer screening: PSA blood test around age 31  Lung cancer screening: CT chest every year for those age 31 to 31 years old with ?30 pack year smoking history, who either currently smoke or have quit within the past 15 years. Infection screenings . HIV: recommended screening at least once age 31-65, more often as needed. . Gonorrhea/Chlamydia: screening as needed. . Hepatitis C:  recommended for anyone born [id number] . TB: certain at-risk populations, or depending on work requirements and/or travel history  Other . Bone Density Test: recommended for men at age 31, sooner depending on risk factors . Abdominal Aortic Aneurysm: screening with ultrasound recommended once for men age 31-75 who have ever smoked          Fatty Liver Disease  Fatty liver disease occurs when too much fat has built up in your liver cells. Fatty liver disease is also called hepatic steatosis or steatohepatitis.  The liver removes harmful substances from your bloodstream and produces fluids that your body needs. It also helps your body use and store energy from the food you eat. In many cases, fatty liver disease does not cause symptoms or problems. It is often diagnosed when tests are being done for other reasons. However, over time, fatty liver can cause inflammation that may lead to more serious liver problems, such as scarring of the liver (cirrhosis) and liver failure. Fatty liver is associated with insulin resistance, increased body fat, high blood pressure (hypertension), and high cholesterol. These are features of metabolic syndrome and increase your risk for stroke, diabetes, and heart disease. What are the causes? This condition may be caused by:  Drinking too much alcohol.  Poor nutrition.  Obesity.  Cushing's syndrome.  Diabetes.  High cholesterol.  Certain drugs.  Poisons.  Some viral infections.  Pregnancy. What increases the risk? You are more likely to develop this condition if you:  Abuse alcohol.  Are overweight.  Have diabetes.  Have hepatitis.  Have a high triglyceride level.  Are pregnant. What are the signs or symptoms? Fatty liver disease often does not cause symptoms. If symptoms do develop, they can include:  Fatigue.  Weakness.  Weight loss.  Confusion.  Abdominal pain.  Nausea and vomiting.  Yellowing of your skin and the white parts of your eyes (jaundice).  Itchy skin. How is this diagnosed? This condition may be diagnosed by:  A physical exam and medical history.  Blood tests.  Imaging tests, such as an ultrasound, CT scan, or MRI.  A liver biopsy. A small sample of liver tissue is removed using a needle. The sample is then looked at under a microscope. How is this treated? Fatty liver disease is often caused by other health conditions. Treatment for fatty liver may involve medicines and lifestyle changes to manage conditions  such as:  Alcoholism.  High cholesterol.  Diabetes.  Being overweight or obese. Follow these instructions at home:   Do not drink alcohol. If you have trouble quitting, ask your health care provider how to safely quit with the help of medicine or a supervised program. This is important to keep your condition from getting worse.  Eat a healthy diet as told by your health care provider. Ask your health care provider about working with a diet and nutrition specialist (dietitian) to develop an eating plan.  Exercise regularly. This can help you lose weight and control your cholesterol and diabetes. Talk to your health care provider about an exercise plan and which activities are best for you.  Take over-the-counter and prescription medicines only as told by your health care provider.  Keep all follow-up visits as told by your health care provider. This is important. Contact a health care provider if: You have trouble controlling your:  Blood sugar. This is especially important if you have diabetes.  Cholesterol.  Drinking of alcohol. Get help right away if:  You have abdominal  pain.  You have jaundice.  You have nausea and vomiting.  You vomit blood or material that looks like coffee grounds.  You have stools that are black, tar-like, or bloody. Summary  Fatty liver disease develops when too much fat builds up in the cells of your liver.  Fatty liver disease often causes no symptoms or problems. However, over time, fatty liver can cause inflammation that may lead to more serious liver problems, such as scarring of the liver (cirrhosis).  You are more likely to develop this condition if you abuse alcohol, are pregnant, are overweight, have diabetes, have hepatitis, or have high triglyceride levels.  Contact your health care provider if you have trouble controlling your weight, blood sugar, cholesterol, or drinking of alcohol. This information is not intended to replace  advice given to you by your health care provider. Make sure you discuss any questions you have with your health care provider. Document Released: 02/22/2005 Document Revised: 12/20/2016 Document Reviewed: 10/16/2016 Elsevier Patient Education  2020 [employer]          Visit summary with medication list and pertinent instructions was printed for patient to review. All questions at time of visit were answered - patient instructed to contact office with any additional concerns or updates. ER/RTC precautions were reviewed with the patient.    Please note: voice recognition software was used to produce this document, and typos may escape review. Please contact Dr. Lyn HollingsheadAlexander for any needed clarifications.N Follow-up plan: Return in about 1 year (around 07/30/2019) for [facility] (call week prior to visit for lab orders) -call or message me sooner if needed.

## 2018-08-06 DIAGNOSIS — L720 Epidermal cyst: Secondary | ICD-10-CM | POA: Diagnosis not present

## 2018-08-06 DIAGNOSIS — L700 Acne vulgaris: Secondary | ICD-10-CM | POA: Diagnosis not present

## 2018-08-24 DIAGNOSIS — L720 Epidermal cyst: Secondary | ICD-10-CM | POA: Diagnosis not present

## 2018-09-04 DIAGNOSIS — L720 Epidermal cyst: Secondary | ICD-10-CM | POA: Diagnosis not present

## 2019-10-11 ENCOUNTER — Encounter: Payer: Self-pay | Admitting: Osteopathic Medicine

## 2019-10-11 ENCOUNTER — Ambulatory Visit (INDEPENDENT_AMBULATORY_CARE_PROVIDER_SITE_OTHER): Payer: Managed Care, Other (non HMO) | Admitting: Osteopathic Medicine

## 2019-10-11 VITALS — BP 129/88 | HR 81 | Wt 231.0 lb

## 2019-10-11 DIAGNOSIS — R197 Diarrhea, unspecified: Secondary | ICD-10-CM

## 2019-10-11 MED ORDER — DIPHENOXYLATE-ATROPINE 2.5-0.025 MG PO TABS: 1.0000 | ORAL_TABLET | Freq: Four times a day (QID) | ORAL | 0 refills | Status: AC | PRN

## 2019-10-11 NOTE — Progress Notes (Signed)
HPI: Rmani Kellogg is a 34 y.o. male who  has no past medical history on file.  he presents to [facility] today, 10/11/19,  for chief complaint of: diarrhea  Patient reports one month of diarrhea, with 4-5+ loose, watery stools per day. He denies any dietary changes, but states he has been drinking more water and recently switched to [employer] bottled water instead of [employer] brand. Otherwise, he has not noticed any dietary triggers. He states he has been eating less, because the diarrhea has gotten worse, so he didn't eat dinner some nights to avoid staying up all night with diarrhea. He also noted BRB when wiping about a week ago after straining. He states this lasted for 3-4 days but has not recurred since then. He also had vomiting 1-2x per day for about two weeks, but has not had any this week. He states he would get hot and nauseous while having diarrhea and have NBNB vomiting. He has not tried anything OTC. He denies any recent travel. He states his wife had twins two months ago which has caused increased stress. He has increased his alcohol intake to 4-5 beers per day from 2 beers per day. He has also started smoking cigarettes again, 1-2 packs per week.   Wt Readings from Last 3 Encounters:  10/11/19 231 lb (104.8 kg)  07/30/18 237 lb 14.4 oz (107.9 kg)  07/14/18 242 lb 9.6 oz (110 kg)     Past medical, surgical, social and family history reviewed:  Patient Active Problem List   Diagnosis Date Noted  . Excessive drinking alcohol 09/27/2016  . Insomnia 09/27/2016  . 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 Elevated liver enzymes 10/31/2015    No past surgical history on file.  Social History   Tobacco Use  . Smoking status: Current Every Day Smoker    Packs/day: 0.25    Types: Cigarettes  . Smokeless tobacco: Never Used  Substance Use Topics  . Alcohol use: Yes    Alcohol/week: 5.0 standard drinks    Types: 5 Cans of beer per week    Family  History  Problem Relation Age of Onset  . Hypertension Mother   . Hypertension Father   . Alcohol abuse Maternal Uncle      Current medication list and allergy/intolerance information reviewed:    Current Outpatient Medications  Medication Sig Dispense Refill  . diphenoxylate-atropine (LOMOTIL) 2.5-0.025 MG tablet Take 1-2 tablets by mouth 4 (four) times daily as needed for diarrhea or loose stools. MAX 6 PILLS PER 24 HOURS 30 tablet 0  . traZODone (DESYREL) 50 MG tablet Take 1-2 tablets (50-100 mg total) by mouth at bedtime as needed for sleep. (Patient not taking: Reported on 10/11/2019) 90 tablet 1   No current facility-administered medications for this visit.    No Known Allergies    Review of Systems:  Constitutional:  No  fever, no chills, No recent illness  HEENT: No  headache, no vision change  Cardiac: No  chest pain  Respiratory:  No  shortness of breath. No  Cough  Gastrointestinal: No  abdominal pain, No  nausea, +  vomiting,  +  blood in stool, +  diarrhea, No  constipation   Musculoskeletal: No new myalgia/arthralgia  Skin: No  Rash, No other wounds/concerning lesions   Neurologic: No  weakness, No  dizziness  Psychiatric: No  concerns with depression  Exam:  BP 129/88 (BP Location: Left Arm, Patient Position: Sitting)   Pulse 81  Wt 231 lb (104.8 kg)   SpO2 97%   BMI 30.48 kg/m   Constitutional: VS see above. General Appearance: alert, well-developed, well-nourished, NAD  Eyes: Normal lids and conjunctive, non-icteric sclera   Neck: No masses, trachea midline.   Respiratory: Normal respiratory effort. no wheeze, no rhonchi, no rales  Cardiovascular: S1/S2 normal, no murmur, no rub/gallop auscultated. RRR.   Gastrointestinal: Nontender, no masses. No hepatomegaly, no splenomegaly. No hernia appreciated. Bowel sounds normal. Rectal exam deferred.   Musculoskeletal: Gait normal. No clubbing/cyanosis of digits.   Neurological: Normal  balance/coordination. No tremor. No cranial nerve deficit on limited exam.  Skin: warm, dry, intact. No rash/ulcer. No concerning nevi or subq nodules on limited exam.   Psychiatric: Normal judgment/insight. Normal mood and affect. Oriented x3.    No results found for this or any previous visit (from the past 72 hour(s)).  No results found.   ASSESSMENT/PLAN: The encounter diagnosis was Diarrhea, unspecified type. Suspect infectious versus functional, colitis/abscess seems less likely given no localized pain, ulcer seems unlikely    Chronic diarrhea  Symptoms have been ongoing for one month now. No abdominal pain. No recent travel. No dietary changes or notable triggers.  Ordered CBC, CMP, lipase, amylase, TSH, TTG antibodies, and stool studies including culture, ova/parasites, WBC/lactoferrin, and C Diff.  Advised PRN imodium and prescribed lomitil for severe symptoms. Trial Low FODMAP though IBS seems unlikely.   Follow up pending lab results. May need CT abdomen or GI evaluation +/- colonoscopy if labs do not reveal a cause for symptoms.  Reviewed ED precautions if symptoms worsen or do not resolve, or if he develops BRB per rectum, bloody or coffee ground emesis, abdominal pain, or fevers.  Orders Placed This Encounter  Procedures  . Stool culture  . Ova and parasite examination  . CBC with Differential/Platelet  . COMPLETE METABOLIC PANEL WITH GFR  . Lipase  . Amylase  . Tissue Transglutaminase Abs,IgG,IgA  . Stool, WBC/Lactoferrin  . C. difficile GDH and Toxin A/B  . TSH    Meds ordered this encounter  Medications  . diphenoxylate-atropine (LOMOTIL) 2.5-0.025 MG tablet    Sig: Take 1-2 tablets by mouth 4 (four) times daily as needed for diarrhea or loose stools. MAX 6 PILLS PER 24 HOURS    Dispense:  30 tablet    Refill:  0    Patient Instructions   Will get some labs and stool studies to assess cause, possible infection  Can try treatment with Imodium as  needed  I've also a Rx which might work a bit better See list of foods to avoid below - note if any of these trigger symptoms  If worse / if labs inconclusive, will get CT scan and refer to specialist  If severe pain, especially if fever/chills, severe bloody stool or bloody/black vomit - to hospital!  Work on reducing/eliminating alcohol use      2017 UpToDate Characteristics and sources of common FODMAPs  Word that corresponds to letter in acronym Compounds in this category Foods that contain these compounds  F Fermentable  O Oligosaccharides Fructans, galacto-oligosaccharides Wheat, barley, rye, onion, leek, white part of spring onion, garlic, shallots, artichokes, beetroot, fennel, peas, chicory, pistachio, cashews, legumes, lentils, and chickpeas   D Disaccharides Lactose Milk, custard, ice cream, and yogurt   M Monosaccharides "Free fructose" (fructose in excess of glucose) Apples, pears, mangoes, cherries, watermelon, asparagus, sugar snap peas, honey, high-fructose corn syrup   A And  P Polyols Sorbitol, mannitol, maltitol,  and xylitol Apples, pears, apricots, cherries, nectarines, peaches, plums, watermelon, mushrooms, cauliflower, artificially sweetened chewing gum and confectionery    FODMAPs: fermentable oligosaccharides, disaccharides, monosaccharides, and polyols. Adapted by permission from [employer]: [employer] of Gastroenter[employer]. Lonell Face, [employer], [place]. Short-chain carbohydrates and functional gastrointestinal disorders. Am J Gastroenterol 2013; 108:707. Copyright  2013. www.nature.com/ajg. Graphic 56153 Version 2.0         Visit summary with medication list and pertinent instructions was printed for patient to review. All questions at time of visit were answered - patient instructed to contact office with any additional concerns or updates. ER/RTC precautions were reviewed with the patient.   Please note: voice recognition  software was used to produce this document, and typos may escape review. Please contact Dr. Lyn Hollingshead for any needed clarifications.     Follow-up plan: Return for RECHECK PENDING RESULTS / IF WORSE OR CHANGE.  Rutha Bouchard, [facility] MS3

## 2019-10-11 NOTE — Patient Instructions (Addendum)
Will get some labs and stool studies to assess cause, possible infection  Can try treatment with Imodium as needed  I've also a Rx which might work a bit better See list of foods to avoid below - note if any of these trigger symptoms  If worse / if labs inconclusive, will get CT scan and refer to specialist  If severe pain, especially if fever/chills, severe bloody stool or bloody/black vomit - to hospital!  Work on reducing/eliminating alcohol use      2017 UpToDate Characteristics and sources of common FODMAPs  Word that corresponds to letter in acronym Compounds in this category Foods that contain these compounds  F Fermentable  O Oligosaccharides Fructans, galacto-oligosaccharides Wheat, barley, rye, onion, leek, white part of spring onion, garlic, shallots, artichokes, beetroot, fennel, peas, chicory, pistachio, cashews, legumes, lentils, and chickpeas   D Disaccharides Lactose Milk, custard, ice cream, and yogurt   M Monosaccharides "Free fructose" (fructose in excess of glucose) Apples, pears, mangoes, cherries, watermelon, asparagus, sugar snap peas, honey, high-fructose corn syrup   A And  P Polyols Sorbitol, mannitol, maltitol, and xylitol Apples, pears, apricots, cherries, nectarines, peaches, plums, watermelon, mushrooms, cauliflower, artificially sweetened chewing gum and confectionery    FODMAPs: fermentable oligosaccharides, disaccharides, monosaccharides, and polyols. Adapted by permission from [employer]: [employer]astro[employer]. Lonell Face, [employer], [place]. Short-chain carbohydrates and functional gastrointestinal disorders. Am J Gastroenterol 2013; 108:707. Copyright  2013. www.nature.com/ajg. Graphic 62263 Version 2.0

## 2019-10-18 LAB — COMPLETE METABOLIC PANEL WITH GFR
AG Ratio: 1.7 (calc) (ref 1.0–2.5)
ALT: 63 U/L — ABNORMAL HIGH (ref 9–46)
AST: 42 U/L — ABNORMAL HIGH (ref 10–40)
Albumin: 4.9 g/dL (ref 3.6–5.1)
Alkaline phosphatase (APISO): 86 U/L (ref 36–130)
BUN: 9 mg/dL (ref 7–25)
CO2: 21 mmol/L (ref 20–32)
Calcium: 10 mg/dL (ref 8.6–10.3)
Chloride: 103 mmol/L (ref 98–110)
Creat: 1.09 mg/dL (ref 0.60–1.35)
GFR, Est African American: 104 mL/min/{1.73_m2} (ref >=60)
GFR, Est Non African American: 89 mL/min/{1.73_m2} (ref >=60)
Globulin: 2.9 g/dL (calc) (ref 1.9–3.7)
Glucose, Bld: 83 mg/dL (ref 65–99)
Potassium: 4.4 mmol/L (ref 3.5–5.3)
Sodium: 141 mmol/L (ref 135–146)
Total Bilirubin: 0.7 mg/dL (ref 0.2–1.2)
Total Protein: 7.8 g/dL (ref 6.1–8.1)

## 2019-10-18 LAB — TEST AUTHORIZATION

## 2019-10-18 LAB — TISSUE TRANSGLUTAMINASE ABS,IGG,IGA
(tTG) Ab, IgA: <1 U/mL
(tTG) Ab, IgG: <1 U/mL

## 2019-10-18 LAB — LIPASE: Lipase: 17 U/L (ref 7–60)

## 2019-10-18 LAB — AMYLASE: Amylase: 56 U/L (ref 21–101)

## 2019-10-18 LAB — TSH: TSH: 0.66 mIU/L (ref 0.40–4.50)

## 2019-10-19 ENCOUNTER — Other Ambulatory Visit: Payer: Self-pay | Admitting: Osteopathic Medicine

## 2019-10-19 LAB — CBC WITH DIFFERENTIAL/PLATELET
Absolute Monocytes: 855 cells/uL (ref 200–950)
Basophils Absolute: 42 cells/uL (ref 0–200)
Basophils Relative: 0.5 %
Eosinophils Absolute: 282 cells/uL (ref 15–500)
Eosinophils Relative: 3.4 %
HCT: 47.6 % (ref 38.5–50.0)
Hemoglobin: 15.8 g/dL (ref 13.2–17.1)
Lymphs Abs: 2440 cells/uL (ref 850–3900)
MCH: 29.4 pg (ref 27.0–33.0)
MCHC: 33.2 g/dL (ref 32.0–36.0)
MCV: 88.6 fL (ref 80.0–100.0)
MPV: 11.5 fL (ref 7.5–12.5)
Monocytes Relative: 10.3 %
Neutro Abs: 4681 cells/uL (ref 1500–7800)
Neutrophils Relative %: 56.4 %
Platelets: 191 10*3/uL (ref 140–400)
RBC: 5.37 10*6/uL (ref 4.20–5.80)
RDW: 14.5 % (ref 11.0–15.0)
Total Lymphocyte: 29.4 %
WBC: 8.3 10*3/uL (ref 3.8–10.8)

## 2019-10-19 LAB — C. DIFFICILE GDH AND TOXIN A/B
GDH ANTIGEN: NOT DETECTED
MICRO NUMBER:: 10978184
SPECIMEN QUALITY:: ADEQUATE
TOXIN A AND B: NOT DETECTED

## 2019-10-19 LAB — FECAL LACTOFERRIN, QUANT
Fecal Lactoferrin: NEGATIVE
MICRO NUMBER:: 10978150
SPECIMEN QUALITY:: ADEQUATE

## 2019-10-19 LAB — SALMONELLA/SHIGELLA CULT, CAMPY EIA AND SHIGA TOXIN RFL ECOLI
MICRO NUMBER: 10980246
MICRO NUMBER:: 10980247
MICRO NUMBER:: 10980248
Result:: NOT DETECTED
SHIGA RESULT:: NOT DETECTED
SPECIMEN QUALITY: ADEQUATE
SPECIMEN QUALITY:: ADEQUATE
SPECIMEN QUALITY:: ADEQUATE

## 2019-10-19 LAB — OVA AND PARASITE EXAMINATION
CONCENTRATE RESULT:: NONE SEEN
MICRO NUMBER:: 10977056
SPECIMEN QUALITY:: ADEQUATE
TRICHROME RESULT:: NONE SEEN

## 2020-07-31 ENCOUNTER — Encounter: Payer: Self-pay | Admitting: Family Medicine

## 2020-07-31 ENCOUNTER — Other Ambulatory Visit: Payer: Self-pay

## 2020-07-31 ENCOUNTER — Ambulatory Visit (INDEPENDENT_AMBULATORY_CARE_PROVIDER_SITE_OTHER): Payer: Managed Care, Other (non HMO) | Admitting: Family Medicine

## 2020-07-31 VITALS — BP 129/83 | HR 91 | Temp 98.3°F | Resp 16

## 2020-07-31 DIAGNOSIS — J029 Acute pharyngitis, unspecified: Secondary | ICD-10-CM

## 2020-07-31 LAB — POCT RAPID STREP A (OFFICE): Rapid Strep A Screen: NEGATIVE

## 2020-07-31 NOTE — Progress Notes (Signed)
Acute Office Visit  Subjective:    Patient ID: James Russo, male    DOB: Oct 04, 1985, 35 y.o.   MRN: [id number]  Chief Complaint  Patient presents with   Sore Throat    HPI Patient is in today for sore throat.  About two weeks ago his twin sons (78 months old) were positive for strep. He developed a sore throat shortly after and had a negative COVID test. He was taking dayquil/nyquil and thought he was getting a Ha bit better, but for the past several days sore throat has worsened. States he could barely drink water this morning because his throat is so sore. Pain aggravated by swallowing. He has gotten some temporary relief with popsicle. Reports his bilateral tonsillar lymph node areas are tender and swollen. He denies any headaches, body aches, fevers, new fatigue, chest pain, dyspnea, n/v/d, etc.   In a mutually monogamous relationship and not concerned about any recent STD, but agreeable to screening if strep test is negative.    History reviewed. No pertinent past medical history.  History reviewed. No pertinent surgical history.  Family History  Problem Relation Age of Onset   Hypertension Mother    Hypertension Father    Alcohol abuse Maternal Uncle     Social History   Socioeconomic History   Marital status: Single    Spouse name: Not on file   Number of children: Not on file   Years of education: Not on file   Highest education level: Not on file  Occupational History   Not on file  Tobacco Use   Smoking status: Every Day    Packs/day: 0.25    Pack years: 0.00    Types: Cigarettes   Smokeless tobacco: Never  Vaping Use   Vaping Use: Never used  Substance and Sexual Activity   Alcohol use: Yes    Alcohol/week: 5.0 standard drinks    Types: 5 Cans of beer per week   Drug use: Never   Sexual activity: Yes    Partners: Female    Birth control/protection: None  Other Topics Concern   Not on file  Social History Narrative   Not on file   Social  Determinants of Health   Financial Resource Strain: Not on file  Food Insecurity: Not on file  Transportation Needs: Not on file  Physical Activity: Not on file  Stress: Not on file  Social Connections: Not on file  Intimate Partner Violence: Not on file    Outpatient Medications Prior to Visit  Medication Sig Dispense Refill   diphenoxylate-atropine (LOMOTIL) 2.5-0.025 MG tablet Take 1-2 tablets by mouth 4 (four) times daily as needed for diarrhea or loose stools. MAX 6 PILLS PER 24 HOURS 30 tablet 0   traZODone (DESYREL) 50 MG tablet Take 1-2 tablets (50-100 mg total) by mouth at bedtime as needed for sleep. (Patient not taking: Reported on 10/11/2019) 90 tablet 1   No facility-administered medications prior to visit.    No Known Allergies  Review of Systems All review of systems negative except what is listed in the HPI     Objective:    Physical Exam Vitals reviewed.  Constitutional:      Appearance: Normal appearance.  HENT:     Head: Normocephalic and atraumatic.     Nose: Nose normal.     Mouth/Throat:     Mouth: Mucous membranes are moist.     Pharynx: Oropharynx is clear. Posterior oropharyngeal erythema present.  Musculoskeletal:  Cervical back: Normal range of motion and neck supple. No rigidity.  Lymphadenopathy:     Cervical: Cervical adenopathy present.  Neurological:     General: No focal deficit present.     Mental Status: He is alert and oriented to person, place, and time. Mental status is at baseline.  Psychiatric:        Mood and Affect: Mood normal.        Behavior: Behavior normal.        Thought Content: Thought content normal.        Judgment: Judgment normal.    BP 129/83   Pulse 91   Temp 98.3 F (36.8 C)   Resp 16   SpO2 98%  Wt Readings from Last 3 Encounters:  10/11/19 231 lb (104.8 kg)  07/30/18 237 lb 14.4 oz (107.9 kg)  07/14/18 242 lb 9.6 oz (110 kg)    Health Maintenance Due  Topic Date Due   COVID-19 Vaccine (1)  Never done   Pneumococcal Vaccine 66-82 Years old (1 - PCV) Never done    There are no preventive care reminders to display for this patient.   Lab Results  Component Value Date   TSH 0.66 10/11/2019   Lab Results  Component Value Date   WBC 8.3 10/19/2019   HGB 15.8 10/19/2019   HCT 47.6 10/19/2019   MCV 88.6 10/19/2019   PLT 191 10/19/2019   Lab Results  Component Value Date   NA 141 10/11/2019   K 4.4 10/11/2019   CO2 21 10/11/2019   GLUCOSE 83 10/11/2019   BUN 9 10/11/2019   CREATININE 1.09 10/11/2019   BILITOT 0.7 10/11/2019   ALKPHOS 106 09/21/2015   AST 42 (H) 10/11/2019   ALT 63 (H) 10/11/2019   PROT 7.8 10/11/2019   CALCIUM 10.0 10/11/2019   Lab Results  Component Value Date   CHOL 195 07/22/2018   Lab Results  Component Value Date   HDL 56 07/22/2018   Lab Results  Component Value Date   LDLCALC 120 (H) 07/22/2018   Lab Results  Component Value Date   TRIG 91 07/22/2018   Lab Results  Component Value Date   CHOLHDL 3.5 07/22/2018   No results found for: HGBA1C     Assessment & Plan:   1. Sore throat Rapid strep negative. Patient agreeable to throat culture for STD screening and blood work for EBV/CMV. Will updated him with results. In the meantime, continue conservative measures. Patient aware of signs/symptoms requiring further/urgent evaluation.  - POCT rapid strep A - Epstein-Barr virus VCA, IgG - Epstein-Barr virus VCA, IgM - CMV IgM - C. trachomatis/N. gonorrhoeae RNA  Follow-up if symptoms worsen or fail to improve.   Lollie Marrow Reola Calkins, DNP, FNP-C

## 2020-07-31 NOTE — Patient Instructions (Signed)
Will let you know test results as they come in. Negative for strep throat.

## 2020-08-01 LAB — CMV IGM: CMV IgM: <30 AU/mL

## 2020-08-01 LAB — TIQ-NTM

## 2020-08-01 LAB — EPSTEIN-BARR VIRUS VCA, IGG: EBV VCA IgG: 492 U/mL — ABNORMAL HIGH

## 2020-08-01 LAB — EPSTEIN-BARR VIRUS VCA, IGM: EBV VCA IgM: <36 U/mL

## 2020-08-01 NOTE — Progress Notes (Signed)
Labs show that you have had mono in the past, but not currently. Still waiting on the other results.

## 2020-08-01 NOTE — Progress Notes (Signed)
The blood work all came back negative for sources of infection. Unfortunately the lab did not accept the mouth swab for STD testing. We can repeat this if you'd like to schedule a nurse visit.

## 2020-08-01 NOTE — Addendum Note (Signed)
Addended by: Hyman Hopes B on: 08/01/2020 02:28 PM   Modules accepted: Orders

## 2020-08-03 LAB — C. TRACHOMATIS/N. GONORRHOEAE RNA

## 2021-06-25 IMAGING — US ULTRASOUND ABDOMEN LIMITED
1 series · 14 of 25 positions shown · non-contrast
Comparison: None.

CLINICAL DATA: Elevated liver enzymes

EXAM:
ULTRASOUND ABDOMEN LIMITED RIGHT UPPER QUADRANT

[Series 1: ultrasound abdomen limited · 0.18mm/px · 14 of 94 slices shown]
[im 1/94]
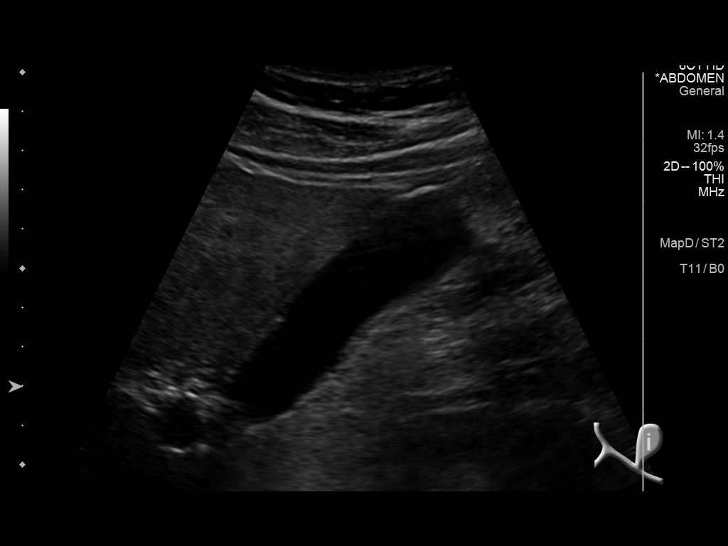
[im 8/94]
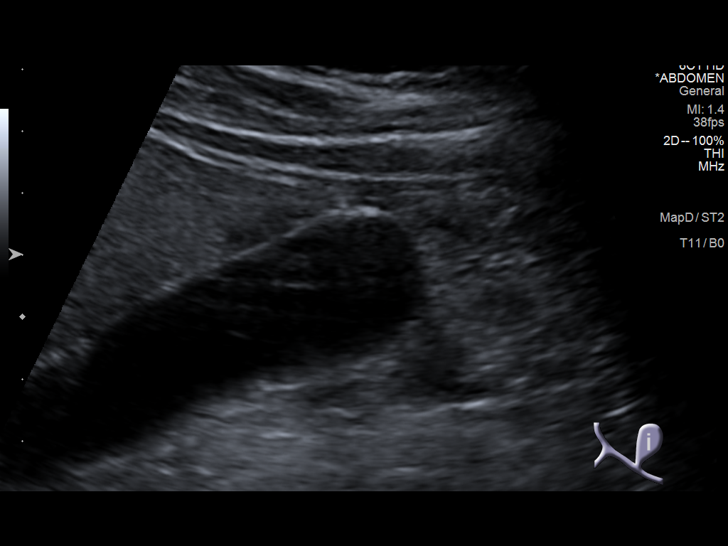
[im 16/94]
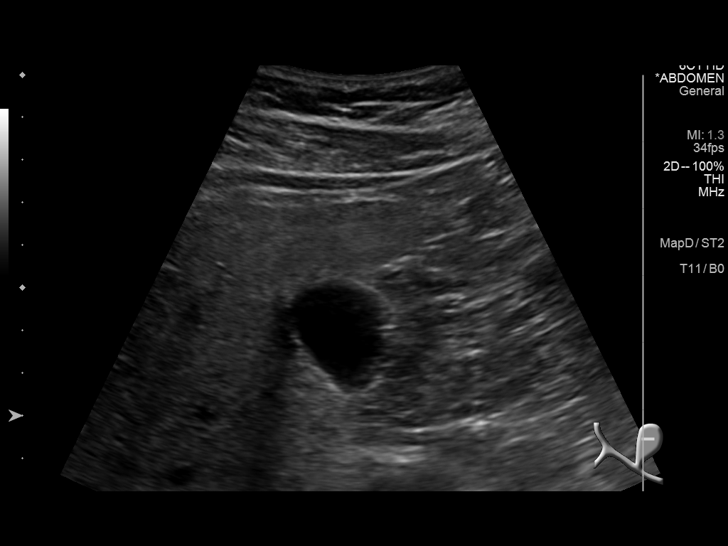
[im 24/94]
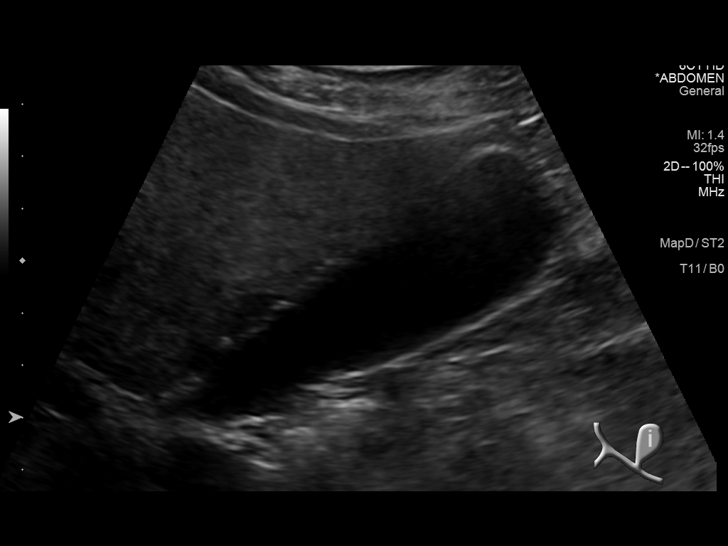
[im 32/94]
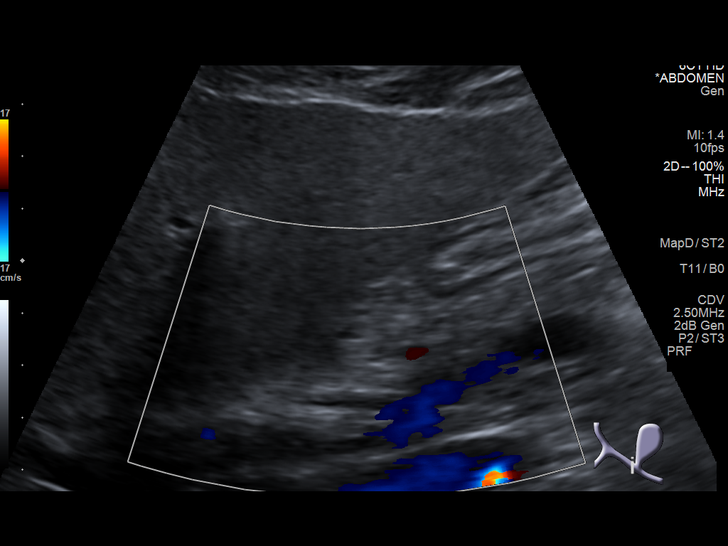
[im 35/94]
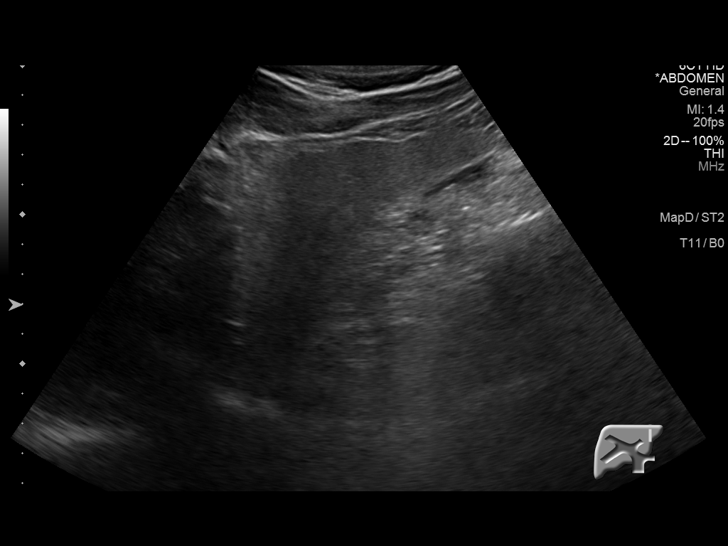
[im 43/94]
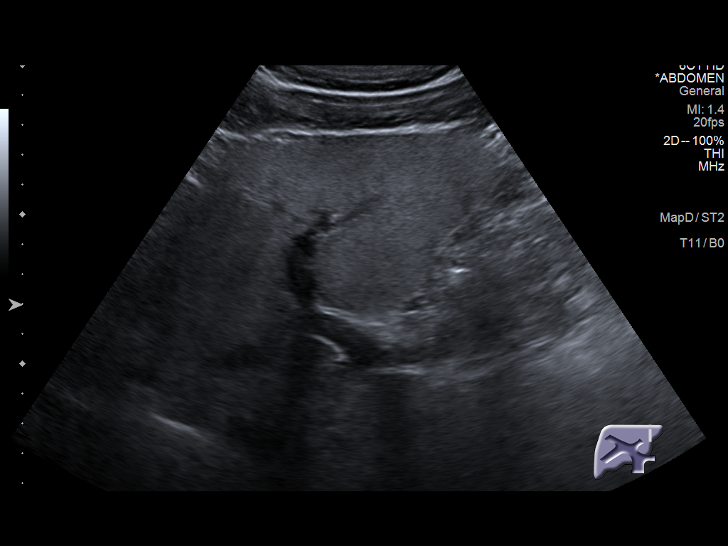
[im 51/94]
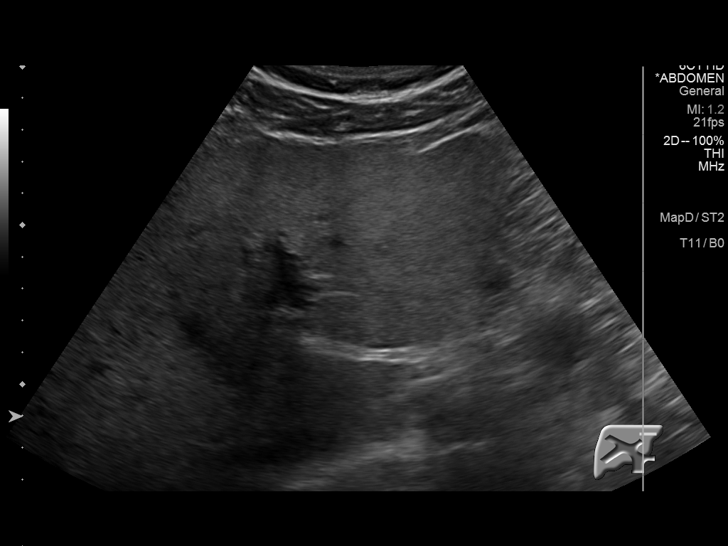
[im 59/94]
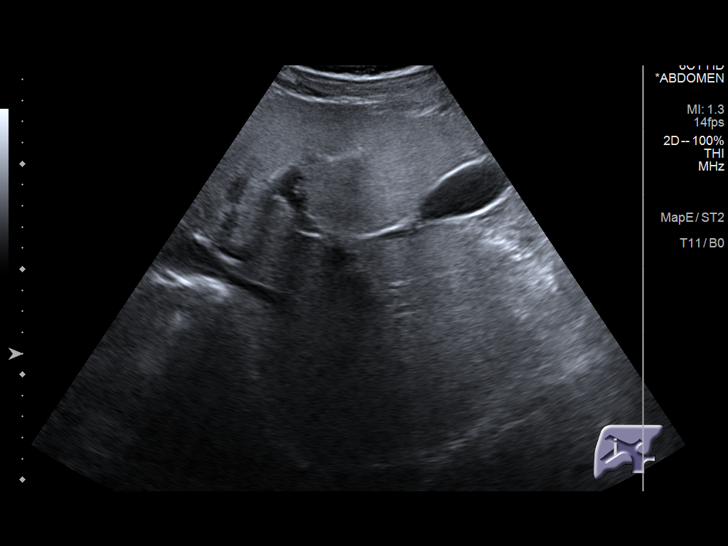
[im 63/94]
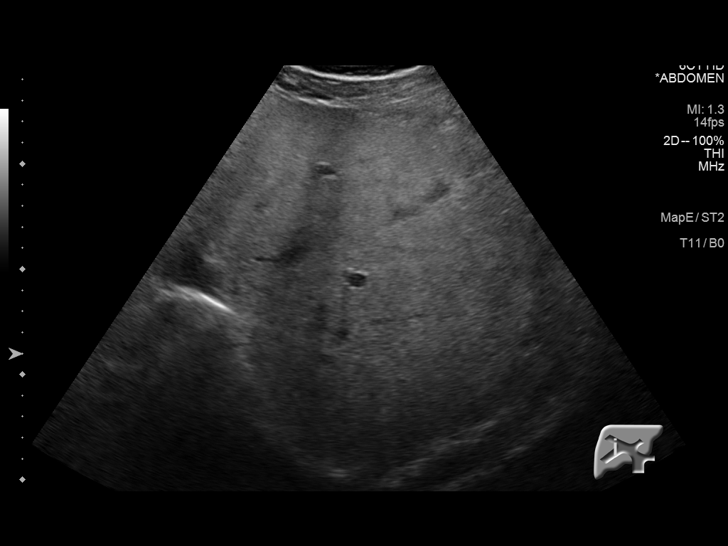
[im 70/94]
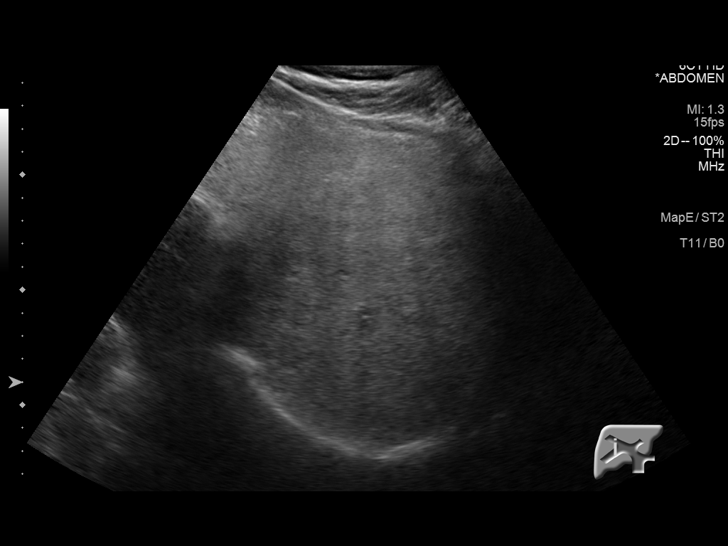
[im 78/94]
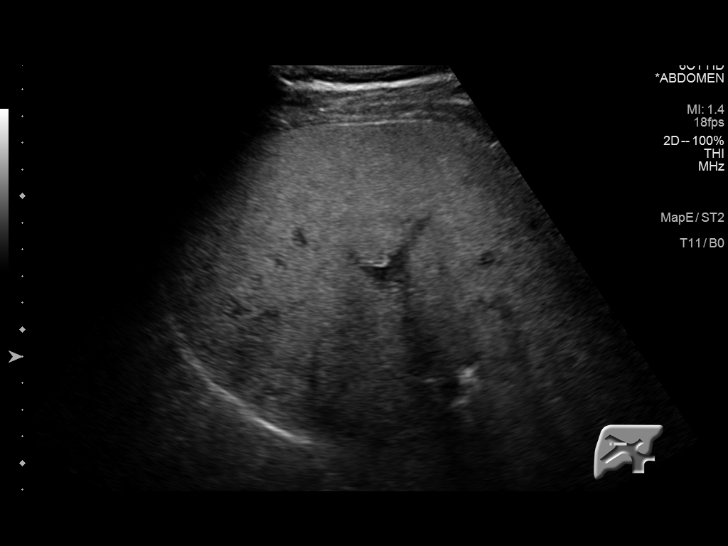
[im 86/94]
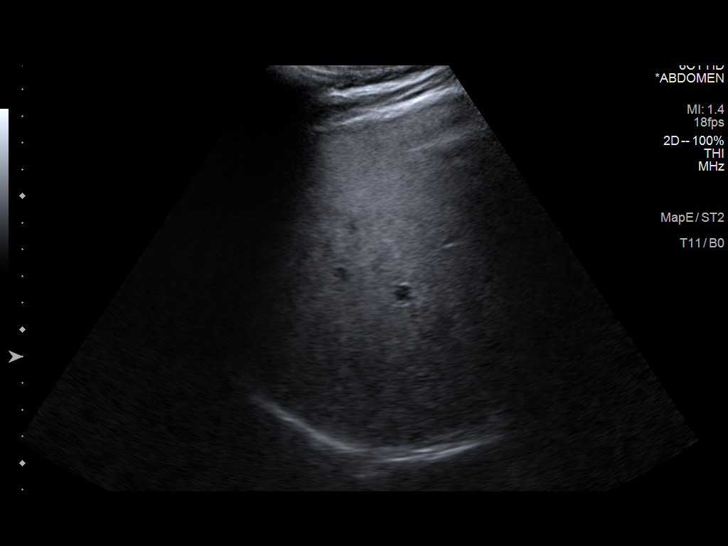
[im 94/94]
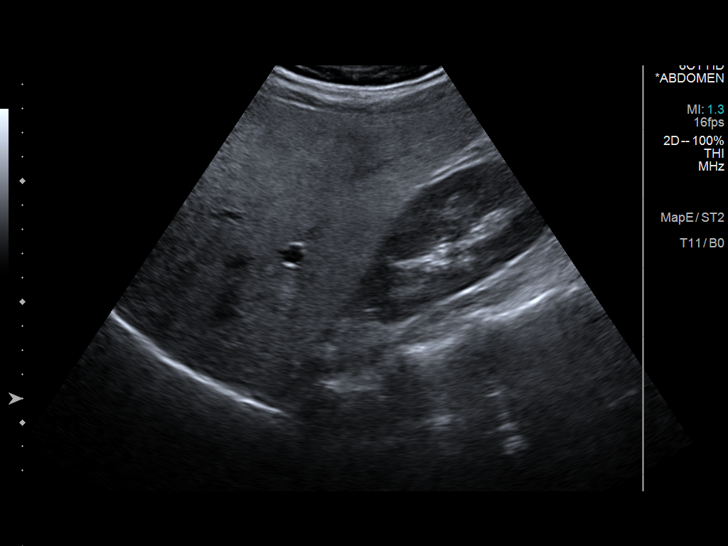

[14 of 25 positions shown; findings below may reference images not displayed]

FINDINGS: Gallbladder:

No gallstones or wall thickening visualized. There is no
pericholecystic fluid. No sonographic Murphy sign noted by
sonographer.

Common bile duct:

Diameter: 3 mm. No intrahepatic or extrahepatic biliary duct
dilatation.

Liver:

No focal lesion identified. Liver echogenicity overall is increased.
Portal vein is patent on color Doppler imaging with normal direction
of blood flow towards the liver.
IMPRESSION: Diffuse increase in liver echogenicity, a finding indicative of
hepatic steatosis. While no focal liver lesions are evident on this
study, it must be cautioned that the sensitivity of ultrasound for
detection of focal liver lesions is diminished in this circumstance.

Study otherwise unremarkable.
